# Patient Record
Sex: Female | Born: 1995 | Race: White | Hispanic: No | Marital: Single | Smoking: Never smoker
Health system: Southern US, Community
[De-identification: ages and names within clinical notes are randomized; demographics above are authoritative.]

## PROBLEM LIST (undated history)

## (undated) DIAGNOSIS — R51 Headache: Secondary | ICD-10-CM

## (undated) DIAGNOSIS — R519 Headache, unspecified: Secondary | ICD-10-CM

## (undated) DIAGNOSIS — L709 Acne, unspecified: Secondary | ICD-10-CM

## (undated) HISTORY — PX: TONSILLECTOMY: SUR1361

## (undated) HISTORY — DX: Acne, unspecified: L70.9

---
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
## 2015-03-28 ENCOUNTER — Encounter (HOSPITAL_COMMUNITY)
Admission: EM | Disposition: A | Discharge status: Home / Self Care | Payer: Self-pay | Source: Home / Self Care | Attending: Emergency Medicine

## 2015-03-28 ENCOUNTER — Emergency Department (HOSPITAL_COMMUNITY): Payer: BLUE CROSS/BLUE SHIELD | Admitting: Anesthesiology

## 2015-03-28 ENCOUNTER — Emergency Department (HOSPITAL_COMMUNITY): Payer: BLUE CROSS/BLUE SHIELD

## 2015-03-28 ENCOUNTER — Observation Stay (HOSPITAL_COMMUNITY)
Admission: EM | Admit: 2015-03-28 | Discharge: 2015-03-29 | Disposition: A | Discharge status: Home / Self Care | Payer: BLUE CROSS/BLUE SHIELD | Attending: General Surgery | Admitting: General Surgery

## 2015-03-28 ENCOUNTER — Encounter (HOSPITAL_COMMUNITY): Payer: Self-pay | Admitting: *Deleted

## 2015-03-28 DIAGNOSIS — Z7982 Long term (current) use of aspirin: Secondary | ICD-10-CM | POA: Diagnosis not present

## 2015-03-28 DIAGNOSIS — K353 Acute appendicitis with localized peritonitis, without perforation or gangrene: Secondary | ICD-10-CM

## 2015-03-28 DIAGNOSIS — Z793 Long term (current) use of hormonal contraceptives: Secondary | ICD-10-CM | POA: Diagnosis not present

## 2015-03-28 DIAGNOSIS — K37 Unspecified appendicitis: Secondary | ICD-10-CM | POA: Diagnosis present

## 2015-03-28 DIAGNOSIS — R1031 Right lower quadrant pain: Secondary | ICD-10-CM | POA: Diagnosis present

## 2015-03-28 HISTORY — PX: LAPAROSCOPIC APPENDECTOMY: SHX408

## 2015-03-28 HISTORY — PX: APPENDECTOMY: SHX54

## 2015-03-28 HISTORY — DX: Headache, unspecified: R51.9

## 2015-03-28 HISTORY — DX: Headache: R51

## 2015-03-28 LAB — CBC WITH DIFFERENTIAL/PLATELET
Basophils Absolute: 0.1 10*3/uL (ref 0.0–0.1)
Basophils Relative: 0 %
Eosinophils Absolute: 0.1 10*3/uL (ref 0.0–0.7)
Eosinophils Relative: 1 %
HEMATOCRIT: 39.1 % (ref 36.0–46.0)
Hemoglobin: 13.6 g/dL (ref 12.0–15.0)
LYMPHS PCT: 16 %
Lymphs Abs: 1.8 10*3/uL (ref 0.7–4.0)
MCH: 32.4 pg (ref 26.0–34.0)
MCHC: 34.8 g/dL (ref 30.0–36.0)
MCV: 93.1 fL (ref 78.0–100.0)
MONO ABS: 1.1 10*3/uL — AB (ref 0.1–1.0)
MONOS PCT: 10 %
NEUTROS ABS: 8.1 10*3/uL — AB (ref 1.7–7.7)
Neutrophils Relative %: 73 %
Platelets: 241 10*3/uL (ref 150–400)
RBC: 4.2 MIL/uL (ref 3.87–5.11)
RDW: 11.7 % (ref 11.5–15.5)
WBC: 11.2 10*3/uL — AB (ref 4.0–10.5)

## 2015-03-28 LAB — URINALYSIS, ROUTINE W REFLEX MICROSCOPIC
BILIRUBIN URINE: NEGATIVE
Glucose, UA: NEGATIVE mg/dL
HGB URINE DIPSTICK: NEGATIVE
KETONES UR: NEGATIVE mg/dL
Leukocytes, UA: NEGATIVE
NITRITE: NEGATIVE
PH: 6 (ref 5.0–8.0)
Protein, ur: NEGATIVE mg/dL
Specific Gravity, Urine: 1.021 (ref 1.005–1.030)

## 2015-03-28 LAB — COMPREHENSIVE METABOLIC PANEL
ALK PHOS: 48 U/L (ref 38–126)
ALT: 10 U/L — AB (ref 14–54)
AST: 16 U/L (ref 15–41)
Albumin: 3.6 g/dL (ref 3.5–5.0)
Anion gap: 8 (ref 5–15)
BUN: 10 mg/dL (ref 6–20)
CALCIUM: 9.3 mg/dL (ref 8.9–10.3)
CO2: 24 mmol/L (ref 22–32)
CREATININE: 0.74 mg/dL (ref 0.44–1.00)
Chloride: 109 mmol/L (ref 101–111)
Glucose, Bld: 102 mg/dL — ABNORMAL HIGH (ref 65–99)
Potassium: 4 mmol/L (ref 3.5–5.1)
Sodium: 141 mmol/L (ref 135–145)
Total Bilirubin: 0.8 mg/dL (ref 0.3–1.2)
Total Protein: 5.9 g/dL — ABNORMAL LOW (ref 6.5–8.1)

## 2015-03-28 LAB — I-STAT BETA HCG BLOOD, ED (MC, WL, AP ONLY): I-stat hCG, quantitative: <5 m[IU]/mL (ref <5)

## 2015-03-28 LAB — LIPASE, BLOOD: LIPASE: 27 U/L (ref 11–51)

## 2015-03-28 SURGERY — APPENDECTOMY, LAPAROSCOPIC
Anesthesia: General | Site: Abdomen

## 2015-03-28 MED ORDER — BUPIVACAINE-EPINEPHRINE (PF) 0.25% -1:200000 IJ SOLN
INTRAMUSCULAR | Status: AC
  Filled 2015-03-28: qty 30

## 2015-03-28 MED ORDER — HYDROMORPHONE HCL 1 MG/ML IJ SOLN
0.2500 mg | INTRAMUSCULAR | Status: DC | PRN
  Administered 2015-03-28 (×4): 0.5 mg via INTRAVENOUS

## 2015-03-28 MED ORDER — OXYCODONE HCL 5 MG PO TABS
ORAL_TABLET | ORAL | Status: AC
  Filled 2015-03-28: qty 1

## 2015-03-28 MED ORDER — HYDROMORPHONE HCL 1 MG/ML IJ SOLN
1.0000 mg | Freq: Once | INTRAMUSCULAR | Status: AC
  Administered 2015-03-28: 1 mg via INTRAVENOUS
  Filled 2015-03-28: qty 1

## 2015-03-28 MED ORDER — ACETAMINOPHEN 325 MG PO TABS: 650.0000 mg | ORAL_TABLET | Freq: Four times a day (QID) | ORAL | Status: DC | PRN

## 2015-03-28 MED ORDER — FENTANYL CITRATE (PF) 100 MCG/2ML IJ SOLN
INTRAMUSCULAR | Status: DC | PRN
  Administered 2015-03-28 (×4): 50 ug via INTRAVENOUS

## 2015-03-28 MED ORDER — IOHEXOL 300 MG/ML  SOLN: 25.0000 mL | Freq: Once | INTRAMUSCULAR | Status: DC | PRN

## 2015-03-28 MED ORDER — HYDROMORPHONE HCL 1 MG/ML IJ SOLN
INTRAMUSCULAR | Status: AC
  Administered 2015-03-28: 0.5 mg via INTRAVENOUS
  Filled 2015-03-28: qty 1

## 2015-03-28 MED ORDER — FENTANYL CITRATE (PF) 250 MCG/5ML IJ SOLN
INTRAMUSCULAR | Status: AC
  Filled 2015-03-28: qty 5

## 2015-03-28 MED ORDER — BUPIVACAINE-EPINEPHRINE 0.25% -1:200000 IJ SOLN
INTRAMUSCULAR | Status: DC | PRN
  Administered 2015-03-28: 11 mL

## 2015-03-28 MED ORDER — ONDANSETRON HCL 4 MG/2ML IJ SOLN
INTRAMUSCULAR | Status: DC | PRN
  Administered 2015-03-28: 4 mg via INTRAVENOUS

## 2015-03-28 MED ORDER — ONDANSETRON 4 MG PO TBDP: 4.0000 mg | ORAL_TABLET | Freq: Four times a day (QID) | ORAL | Status: DC | PRN

## 2015-03-28 MED ORDER — ONDANSETRON HCL 4 MG/2ML IJ SOLN
4.0000 mg | Freq: Once | INTRAMUSCULAR | Status: AC
  Administered 2015-03-28: 4 mg via INTRAVENOUS
  Filled 2015-03-28: qty 2

## 2015-03-28 MED ORDER — OXYCODONE HCL 5 MG PO TABS
5.0000 mg | ORAL_TABLET | ORAL | Status: DC | PRN
  Administered 2015-03-28: 5 mg via ORAL
  Filled 2015-03-28: qty 2

## 2015-03-28 MED ORDER — LIDOCAINE HCL (CARDIAC) 20 MG/ML IV SOLN
INTRAVENOUS | Status: DC | PRN
  Administered 2015-03-28: 100 mg via INTRAVENOUS

## 2015-03-28 MED ORDER — PROPOFOL 10 MG/ML IV BOLUS
INTRAVENOUS | Status: AC
  Filled 2015-03-28: qty 20

## 2015-03-28 MED ORDER — IOHEXOL 300 MG/ML  SOLN
100.0000 mL | Freq: Once | INTRAMUSCULAR | Status: AC | PRN
  Administered 2015-03-28: 100 mL via INTRAVENOUS

## 2015-03-28 MED ORDER — MIDAZOLAM HCL 5 MG/5ML IJ SOLN
INTRAMUSCULAR | Status: DC | PRN
  Administered 2015-03-28: 2 mg via INTRAVENOUS

## 2015-03-28 MED ORDER — 0.9 % SODIUM CHLORIDE (POUR BTL) OPTIME
TOPICAL | Status: DC | PRN
  Administered 2015-03-28: 1000 mL

## 2015-03-28 MED ORDER — SODIUM CHLORIDE 0.9 % IV BOLUS (SEPSIS)
1000.0000 mL | Freq: Once | INTRAVENOUS | Status: AC
  Administered 2015-03-28: 1000 mL via INTRAVENOUS

## 2015-03-28 MED ORDER — SUGAMMADEX SODIUM 200 MG/2ML IV SOLN
INTRAVENOUS | Status: DC | PRN
  Administered 2015-03-28: 200 mg via INTRAVENOUS

## 2015-03-28 MED ORDER — SIMETHICONE 80 MG PO CHEW: 40.0000 mg | CHEWABLE_TABLET | Freq: Four times a day (QID) | ORAL | Status: DC | PRN

## 2015-03-28 MED ORDER — KETOROLAC TROMETHAMINE 15 MG/ML IJ SOLN
INTRAMUSCULAR | Status: AC
  Filled 2015-03-28: qty 1

## 2015-03-28 MED ORDER — SUCCINYLCHOLINE CHLORIDE 20 MG/ML IJ SOLN
INTRAMUSCULAR | Status: DC | PRN
  Administered 2015-03-28: 80 mg via INTRAVENOUS

## 2015-03-28 MED ORDER — ONDANSETRON HCL 4 MG/2ML IJ SOLN
4.0000 mg | Freq: Four times a day (QID) | INTRAMUSCULAR | Status: DC | PRN
Start: 2015-03-28 — End: 2015-03-29

## 2015-03-28 MED ORDER — MORPHINE SULFATE (PF) 2 MG/ML IV SOLN
2.0000 mg | INTRAVENOUS | Status: DC | PRN
Start: 2015-03-28 — End: 2015-03-29
  Administered 2015-03-28 – 2015-03-29 (×3): 2 mg via INTRAVENOUS
  Filled 2015-03-28 (×3): qty 1

## 2015-03-28 MED ORDER — ROCURONIUM BROMIDE 100 MG/10ML IV SOLN
INTRAVENOUS | Status: DC | PRN
  Administered 2015-03-28: 20 mg via INTRAVENOUS
  Administered 2015-03-28: 10 mg via INTRAVENOUS

## 2015-03-28 MED ORDER — SODIUM CHLORIDE 0.9 % IR SOLN
Status: DC | PRN
Start: 2015-03-28 — End: 2015-03-28
  Administered 2015-03-28: 1000 mL

## 2015-03-28 MED ORDER — MORPHINE SULFATE (PF) 4 MG/ML IV SOLN
4.0000 mg | Freq: Once | INTRAVENOUS | Status: AC
  Administered 2015-03-28: 4 mg via INTRAVENOUS
  Filled 2015-03-28: qty 1

## 2015-03-28 MED ORDER — PROMETHAZINE HCL 25 MG/ML IJ SOLN: 6.2500 mg | INTRAMUSCULAR | Status: DC | PRN

## 2015-03-28 MED ORDER — LACTATED RINGERS IV SOLN
INTRAVENOUS | Status: DC | PRN
  Administered 2015-03-28: 17:00:00 via INTRAVENOUS

## 2015-03-28 MED ORDER — MIDAZOLAM HCL 2 MG/2ML IJ SOLN
INTRAMUSCULAR | Status: AC
  Filled 2015-03-28: qty 2

## 2015-03-28 MED ORDER — SODIUM CHLORIDE 0.9 % IV SOLN: INTRAVENOUS | Status: DC

## 2015-03-28 MED ORDER — DEXTROSE 5 % IV SOLN
2.0000 g | INTRAVENOUS | Status: DC
  Administered 2015-03-28: 2 g via INTRAVENOUS
  Filled 2015-03-28: qty 2

## 2015-03-28 MED ORDER — ACETAMINOPHEN 650 MG RE SUPP: 650.0000 mg | Freq: Four times a day (QID) | RECTAL | Status: DC | PRN

## 2015-03-28 MED ORDER — KETOROLAC TROMETHAMINE 15 MG/ML IJ SOLN
15.0000 mg | Freq: Three times a day (TID) | INTRAMUSCULAR | Status: DC | PRN
  Administered 2015-03-28: 15 mg via INTRAVENOUS
  Filled 2015-03-28: qty 1

## 2015-03-28 MED ORDER — SUGAMMADEX SODIUM 200 MG/2ML IV SOLN
INTRAVENOUS | Status: AC
  Filled 2015-03-28: qty 2

## 2015-03-28 MED ORDER — METHOCARBAMOL 500 MG PO TABS
500.0000 mg | ORAL_TABLET | Freq: Four times a day (QID) | ORAL | Status: DC | PRN
  Administered 2015-03-28 – 2015-03-29 (×2): 500 mg via ORAL
  Filled 2015-03-28 (×2): qty 1

## 2015-03-28 MED ORDER — PROPOFOL 10 MG/ML IV BOLUS
INTRAVENOUS | Status: DC | PRN
  Administered 2015-03-28: 200 mg via INTRAVENOUS

## 2015-03-28 SURGICAL SUPPLY — 49 items
APPLIER CLIP ROT 10 11.4 M/L (STAPLE)
APR CLP MED LRG 11.4X10 (STAPLE)
BAG SPEC RTRVL 10 TROC 200 (ENDOMECHANICALS) ×1
CANISTER SUCTION 2500CC (MISCELLANEOUS) ×3 IMPLANT
CHLORAPREP W/TINT 26ML (MISCELLANEOUS) ×3 IMPLANT
CLIP APPLIE ROT 10 11.4 M/L (STAPLE) IMPLANT
CLOSURE STERI-STRIP 1/4X4 (GAUZE/BANDAGES/DRESSINGS) ×2 IMPLANT
CLOSURE WOUND 1/2 X4 (GAUZE/BANDAGES/DRESSINGS) ×1
COVER SURGICAL LIGHT HANDLE (MISCELLANEOUS) ×3 IMPLANT
CUTTER FLEX LINEAR 45M (STAPLE) ×3 IMPLANT
DEVICE TROCAR PUNCTURE CLOSURE (ENDOMECHANICALS) ×3 IMPLANT
ELECT REM PT RETURN 9FT ADLT (ELECTROSURGICAL) ×3
ELECTRODE REM PT RTRN 9FT ADLT (ELECTROSURGICAL) ×1 IMPLANT
GLOVE BIO SURGEON STRL SZ7 (GLOVE) ×5 IMPLANT
GLOVE BIOGEL PI IND STRL 7.0 (GLOVE) IMPLANT
GLOVE BIOGEL PI IND STRL 7.5 (GLOVE) ×1 IMPLANT
GLOVE BIOGEL PI INDICATOR 7.0 (GLOVE) ×2
GLOVE BIOGEL PI INDICATOR 7.5 (GLOVE) ×2
GLOVE SURG SS PI 6.5 STRL IVOR (GLOVE) ×2 IMPLANT
GLOVE SURG SS PI 7.0 STRL IVOR (GLOVE) ×2 IMPLANT
GLOVE SURG SS PI 7.5 STRL IVOR (GLOVE) ×2 IMPLANT
GOWN STRL REUS W/ TWL LRG LVL3 (GOWN DISPOSABLE) ×3 IMPLANT
GOWN STRL REUS W/TWL LRG LVL3 (GOWN DISPOSABLE) ×9
KIT BASIN OR (CUSTOM PROCEDURE TRAY) ×3 IMPLANT
KIT ROOM TURNOVER OR (KITS) ×3 IMPLANT
LIQUID BAND (GAUZE/BANDAGES/DRESSINGS) ×3 IMPLANT
NS IRRIG 1000ML POUR BTL (IV SOLUTION) ×3 IMPLANT
PAD ARMBOARD 7.5X6 YLW CONV (MISCELLANEOUS) ×6 IMPLANT
POUCH RETRIEVAL ECOSAC 10 (ENDOMECHANICALS) ×1 IMPLANT
POUCH RETRIEVAL ECOSAC 10MM (ENDOMECHANICALS) ×2
RELOAD 45 VASCULAR/THIN (ENDOMECHANICALS) ×3 IMPLANT
RELOAD STAPLE 45 2.5 WHT GRN (ENDOMECHANICALS) ×1 IMPLANT
RELOAD STAPLE 45 3.5 BLU ETS (ENDOMECHANICALS) IMPLANT
RELOAD STAPLE TA45 3.5 REG BLU (ENDOMECHANICALS) ×3 IMPLANT
SCALPEL HARMONIC ACE (MISCELLANEOUS) ×3 IMPLANT
SCISSORS LAP 5X35 DISP (ENDOMECHANICALS) ×2 IMPLANT
SET IRRIG TUBING LAPAROSCOPIC (IRRIGATION / IRRIGATOR) ×3 IMPLANT
SLEEVE ENDOPATH XCEL 5M (ENDOMECHANICALS) ×3 IMPLANT
SPECIMEN JAR SMALL (MISCELLANEOUS) ×3 IMPLANT
STRIP CLOSURE SKIN 1/2X4 (GAUZE/BANDAGES/DRESSINGS) ×2 IMPLANT
SUT MNCRL AB 4-0 PS2 18 (SUTURE) ×3 IMPLANT
SUT VICRYL 0 UR6 27IN ABS (SUTURE) ×3 IMPLANT
TOWEL OR 17X24 6PK STRL BLUE (TOWEL DISPOSABLE) ×3 IMPLANT
TOWEL OR 17X26 10 PK STRL BLUE (TOWEL DISPOSABLE) ×3 IMPLANT
TRAY FOLEY CATH 16FR SILVER (SET/KITS/TRAYS/PACK) ×3 IMPLANT
TRAY LAPAROSCOPIC MC (CUSTOM PROCEDURE TRAY) ×3 IMPLANT
TROCAR XCEL BLUNT TIP 100MML (ENDOMECHANICALS) ×3 IMPLANT
TROCAR XCEL NON-BLD 5MMX100MML (ENDOMECHANICALS) ×3 IMPLANT
TUBING INSUFFLATION (TUBING) ×3 IMPLANT

## 2015-03-28 NOTE — H&P (Signed)
Ashlee Acosta is an 20 y.o. female.   Chief Complaint: abd pain HPI: Ashlee Acosta who began to have abd pain last night now primarily in rlq.  Nausea and emesis. No fever. Presented to er and underwent ct that shows appendicitis   History reviewed. No pertinent past medical history.  Past Surgical History  Procedure Laterality Date  . Tonsillectomy      History reviewed. No pertinent family history. Social History:  reports that she has never smoked. She does not have any smokeless tobacco history on file. She reports that she does not drink alcohol or use illicit drugs.  Allergies: No Known Allergies  meds none  Results for orders placed or performed during the hospital encounter of 03/28/15 (from the past 48 hour(s))  CBC with Differential     Status: Abnormal   Collection Time: 03/28/15 10:15 AM  Result Value Ref Range   WBC 11.2 (H) 4.0 - 10.5 K/uL   RBC 4.20 3.87 - 5.11 MIL/uL   Hemoglobin 13.6 12.0 - 15.0 g/dL   HCT 39.1 36.0 - 46.0 %   MCV 93.1 78.0 - 100.0 fL   MCH 32.4 26.0 - 34.0 pg   MCHC 34.8 30.0 - 36.0 g/dL   RDW 11.7 11.5 - 15.5 %   Platelets 241 150 - 400 K/uL   Neutrophils Relative % Ashlee %   Neutro Abs 8.1 (H) 1.7 - 7.7 K/uL   Lymphocytes Relative 16 %   Lymphs Abs 1.8 0.7 - 4.0 K/uL   Monocytes Relative 10 %   Monocytes Absolute 1.1 (H) 0.1 - 1.0 K/uL   Eosinophils Relative 1 %   Eosinophils Absolute 0.1 0.0 - 0.7 K/uL   Basophils Relative 0 %   Basophils Absolute 0.1 0.0 - 0.1 K/uL  Comprehensive metabolic panel     Status: Abnormal   Collection Time: 03/28/15 10:15 AM  Result Value Ref Range   Sodium 141 135 - 145 mmol/L   Potassium 4.0 3.5 - 5.1 mmol/L   Chloride 109 101 - 111 mmol/L   CO2 24 22 - 32 mmol/L   Glucose, Bld 102 (H) 65 - 99 mg/dL   BUN 10 6 - 20 mg/dL   Creatinine, Ser 0.74 0.44 - 1.00 mg/dL   Calcium 9.3 8.9 - 10.3 mg/dL   Total Protein 5.9 (L) 6.5 - 8.1 g/dL   Albumin 3.6 3.5 - 5.0 g/dL   AST 16 15 - 41 U/L   ALT 10 (L) 14 - 54 U/L    Alkaline Phosphatase 48 38 - 126 U/L   Total Bilirubin 0.8 0.3 - 1.2 mg/dL   GFR calc non Af Amer >60 >60 mL/min   GFR calc Af Amer >60 >60 mL/min    Comment: (NOTE) The eGFR has been calculated using the CKD EPI equation. This calculation has not been validated in all clinical situations. eGFR's persistently <60 mL/min signify possible Chronic Kidney Disease.    Anion gap 8 5 - 15  Urinalysis, Routine w reflex microscopic (not at [facility])     Status: None   Collection Time: 03/28/15 10:15 AM  Result Value Ref Range   Color, Urine YELLOW YELLOW   APPearance CLEAR CLEAR   Specific Gravity, Urine 1.021 1.005 - 1.030   pH 6.0 5.0 - 8.0   Glucose, UA NEGATIVE NEGATIVE mg/dL   Hgb urine dipstick NEGATIVE NEGATIVE   Bilirubin Urine NEGATIVE NEGATIVE   Ketones, ur NEGATIVE NEGATIVE mg/dL   Protein, ur NEGATIVE NEGATIVE mg/dL  Nitrite NEGATIVE NEGATIVE   Leukocytes, UA NEGATIVE NEGATIVE    Comment: MICROSCOPIC NOT DONE ON URINES WITH NEGATIVE PROTEIN, BLOOD, LEUKOCYTES, NITRITE, OR GLUCOSE <1000 mg/dL.  Lipase, blood     Status: None   Collection Time: 03/28/15 10:15 AM  Result Value Ref Range   Lipase 27 11 - 51 U/L  I-Stat Beta hCG blood, ED ([facility], [facility], [facility] only)     Status: None   Collection Time: 03/28/15 10:20 AM  Result Value Ref Range   I-stat hCG, quantitative <5.0 <5 mIU/mL   Comment 3            Comment:   GEST. AGE      CONC.  (mIU/mL)   <=1 WEEK        5 - 50     2 WEEKS       50 - 500     3 WEEKS       100 - 10,000     4 WEEKS     1,000 - 30,000        FEMALE AND NON-PREGNANT FEMALE:     LESS THAN 5 mIU/mL    Ct Abdomen Pelvis W Contrast  03/28/2015  CLINICAL DATA:  Right lower quadrant pain radiating to the right flank today. EXAM: CT ABDOMEN AND PELVIS WITH CONTRAST TECHNIQUE: Multidetector CT imaging of the abdomen and pelvis was performed using the standard protocol following bolus administration of intravenous contrast. CONTRAST:  180m OMNIPAQUE IOHEXOL 300 MG/ML   SOLN COMPARISON:  None. FINDINGS: Lower chest:  Unremarkable. Hepatobiliary: 6 mm hypodensity right liver is too small to characterize. Liver otherwise normal in appearance. There is no evidence for gallstones, gallbladder wall thickening, or pericholecystic fluid. No intrahepatic or extrahepatic biliary dilation. Pancreas: No focal mass lesion. No dilatation of the main duct. No intraparenchymal cyst. No peripancreatic edema. Spleen: No splenomegaly. No focal mass lesion. Adrenals/Urinary Tract: No adrenal nodule or mass. Kidneys are normal in appearance bilaterally. No evidence for hydroureter. The urinary bladder appears normal for the degree of distention. Stomach/Bowel: Stomach is nondistended. No gastric wall thickening. No evidence of outlet obstruction. Duodenum is normally positioned as is the ligament of Treitz. No small bowel wall thickening. No small bowel dilatation. The terminal ileum is normal. Tip of appendix dilated up to 11-12 mm diameter with wall thickening and periappendiceal edema/inflammation. No evidence for extraluminal gas or abscess. No gross colonic mass. No colonic wall thickening. No substantial diverticular change. Vascular/Lymphatic: No abdominal aortic aneurysm. No abdominal atherosclerotic calcification. There is no gastrohepatic or hepatoduodenal ligament lymphadenopathy. No intraperitoneal or retroperitoneal lymphadenopy. Small lymph nodes identified in the ileocolic ligament. No pelvic sidewall lymphadenopathy. Reproductive: Uterus is unremarkable.  There is no adnexal mass. Other: No substantial intraperitoneal free fluid. Musculoskeletal: Bone windows reveal no worrisome lytic or sclerotic osseous lesions. IMPRESSION: Dilated, inflamed appendix with periappendiceal edema/ inflammation. CT imaging features consistent with acute appendicitis. I personally discussed these findings by telephone with Dr. PJohnney Killianat 1230 hours on 03/27/2014. Electronically Signed   By: EMisty Stanley M.D.   On: 03/28/2015 12:29    Review of Systems  Constitutional: Negative for fever and chills.  Respiratory: Negative for shortness of breath.   Cardiovascular: Negative for chest pain.  Gastrointestinal: Positive for nausea, vomiting and abdominal pain.    Blood pressure 108/Ashlee, pulse 84, temperature 98.6 F (37 C), temperature source Oral, resp. rate 16, height 5' 5"  (1.651 m), weight 63.504 kg (140 lb), SpO2 100 %. Physical Exam  Vitals reviewed. Constitutional: She appears well-developed and well-nourished.  Eyes: No scleral icterus.  Cardiovascular: Normal rate and regular rhythm.   Respiratory: Effort normal and breath sounds normal.  GI: Soft. There is tenderness in the right lower quadrant.     Assessment/Plan Appendicitis  Appears to have simple appendicitis. Recommended lap appy. Risks discussed. Will need to remove as many piercings as possible and I will need to remove the dermal piercings on her abdominal wall.    Berenice Oehlert 03/28/2015, 3:36 PM

## 2015-03-28 NOTE — Anesthesia Preprocedure Evaluation (Addendum)
Anesthesia Evaluation  Patient identified by MRN, date of birth, ID band Patient awake    Reviewed: Allergy & Precautions, H&P , Patient's Chart, lab work & pertinent test results  History of Anesthesia Complications Negative for: history of anesthetic complications  Airway Mallampati: II  TM Distance: >3 FB Neck ROM: full    Dental no notable dental hx.    Pulmonary neg pulmonary ROS,    Pulmonary exam normal breath sounds clear to auscultation       Cardiovascular negative cardio ROS Normal cardiovascular exam Rhythm:regular Rate:Normal     Neuro/Psych negative neurological ROS     GI/Hepatic negative GI ROS, Neg liver ROS,   Endo/Other  negative endocrine ROS  Renal/GU negative Renal ROS     Musculoskeletal   Abdominal   Peds  Hematology negative hematology ROS (+)   Anesthesia Other Findings Acute appendicitis  HCG negative  Reproductive/Obstetrics                            Anesthesia Physical Anesthesia Plan  ASA: II  Anesthesia Plan: General   Post-op Pain Management:    Induction: Intravenous and Rapid sequence  Airway Management Planned: Oral ETT  Additional Equipment:   Intra-op Plan:   Post-operative Plan: Extubation in OR  Informed Consent: I have reviewed the patients History and Physical, chart, labs and discussed the procedure including the risks, benefits and alternatives for the proposed anesthesia with the patient or authorized representative who has indicated his/her understanding and acceptance.   Dental Advisory Given  Plan Discussed with: Anesthesiologist and CRNA  Anesthesia Plan Comments:         Anesthesia Quick Evaluation

## 2015-03-28 NOTE — ED Notes (Signed)
Dr. Dwain SarnaWakefield (general surgeon) at bedside.

## 2015-03-28 NOTE — Transfer of Care (Signed)
Immediate Anesthesia Transfer of Care Note  Patient: Ashlee Acosta  Procedure(s) Performed: Procedure(s): APPENDECTOMY LAPAROSCOPIC, Removal of Dermal Piercings (x2) (N/A)  Patient Location: PACU  Anesthesia Type:General  Level of Consciousness: awake, alert , patient cooperative and responds to stimulation  Airway & Oxygen Therapy: Patient Spontanous Breathing and Patient connected to nasal cannula oxygen  Post-op Assessment: Report given to RN, Post -op Vital signs reviewed and stable and Patient moving all extremities X 4  Post vital signs: Reviewed and stable  Last Vitals:  Filed Vitals:   03/28/15 1545 03/28/15 1733  BP: 109/80 113/73  Pulse: 80 98  Temp:  36.7 C  Resp:  19    Complications: No apparent anesthesia complications

## 2015-03-28 NOTE — ED Notes (Signed)
Called CT to inquire timeframe for scan.

## 2015-03-28 NOTE — ED Notes (Signed)
Consent for surgery signed and completed.

## 2015-03-28 NOTE — Anesthesia Procedure Notes (Signed)
Procedure Name: Intubation Date/Time: 03/28/2015 4:36 PM Performed by: Virgel GessHOLTZMAN, [place]er [place] LEFFEW Pre-anesthesia Checklist: Patient identified, Patient being monitored, Timeout performed, Emergency Drugs available and Suction available Patient Re-evaluated:Patient Re-evaluated prior to inductionOxygen Delivery Method: Circle System Utilized Preoxygenation: Pre-oxygenation with 100% oxygen Intubation Type: IV induction Ventilation: Mask ventilation without difficulty Laryngoscope Size: Mac and 3 Grade View: Grade I Tube type: Oral Tube size: 7.0 mm Number of attempts: 1 Airway Equipment and Method: Stylet Placement Confirmation: ETT inserted through vocal cords under direct vision,  positive ETCO2 and breath sounds checked- equal and bilateral Secured at: 22 cm Tube secured with: Tape Dental Injury: Teeth and Oropharynx as per pre-operative assessment

## 2015-03-28 NOTE — ED Provider Notes (Signed)
CSN: [id number]     Arrival date & time 03/28/15  0912 History   First MD Initiated Contact with Patient 03/28/15 1013     Chief Complaint  Patient presents with  . Abdominal Pain  . Emesis    HPI   Ms. Jezek is an 20 y.o. female with no significant PMH who presents to the ED for evaluation of abdominal pain. She states she was in her usual state of health until last night when she had sudden onset RLQ pain with associated nausea that woke her from sleep. She states the pain is constant and radiates around her side to her back. She endorses one episode of emesis. Denies diarrhea. States the pain is even worse today. She has not tried anything for the pain. She endorses chills, is unsure if she had fever at home. Denies dysuria, urinary frequency/urgency. Denies vaginal discharge or bleeding. States she still has her appendix.     History reviewed. No pertinent past medical history. Past Surgical History  Procedure Laterality Date  . Tonsillectomy     History reviewed. No pertinent family history. Social History  Substance Use Topics  . Smoking status: Never Smoker   . Smokeless tobacco: None  . Alcohol Use: No   OB History    No data available     Review of Systems  All other systems reviewed and are negative.     Allergies  Review of patient's allergies indicates no known allergies.  Home Medications   Prior to Admission medications   Medication Sig Start Date End Date Taking? Authorizing Provider  aspirin 325 MG tablet Take 325 mg by mouth every 6 (six) hours as needed.   Yes Historical Provider, MD  Aspirin-Acetaminophen-Caffeine (GOODY HEADACHE PO) Take 1 packet by mouth every 8 (eight) hours as needed (headache).   Yes Historical Provider, MD  etonogestrel (NEXPLANON) 68 MG IMPL implant 1 each by Subdermal route once.   Yes Historical Provider, MD  ibuprofen (ADVIL,MOTRIN) 200 MG tablet Take 200 mg by mouth every 6 (six) hours as needed for moderate pain.   Yes  Historical Provider, MD   BP 106/68 mmHg  Pulse 106  Temp(Src) 98.6 F (37 C) (Oral)  Resp 16  Ht 5\' 5"  (1.651 m)  Wt 63.504 kg  BMI 23.30 kg/m2  SpO2 100% Physical Exam  Constitutional: She is oriented to person, place, and time.  Pt appears very uncomfortable, holding right side.   HENT:  Right Ear: External ear normal.  Left Ear: External ear normal.  Nose: Nose normal.  Mouth/Throat: Oropharynx is clear and moist.  Eyes: Conjunctivae and EOM are normal. Pupils are equal, round, and reactive to light.  Neck: Normal range of motion. Neck supple.  Cardiovascular: Normal rate, regular rhythm, normal heart sounds and intact distal pulses.   Pulmonary/Chest: Effort normal and breath sounds normal. No respiratory distress. She exhibits no tenderness.  Abdominal: Soft. Bowel sounds are normal. She exhibits no distension. There is tenderness. There is tenderness at McBurney's point. There is no rebound and no CVA tenderness.  Marked RLQ tenderness with guarding. +Rovsings.   Musculoskeletal: She exhibits no edema.  Neurological: She is alert and oriented to person, place, and time. No cranial nerve deficit.  Skin: Skin is warm and dry.  Psychiatric: She has a normal mood and affect.  Nursing note and vitals reviewed.  Filed Vitals:   03/28/15 1345 03/28/15 1400 03/28/15 1415 03/28/15 1500  BP: 108/69 107/69 95/61 110/87  Pulse: 75 81 71  83  Temp:      TempSrc:      Resp:      Height:      Weight:      SpO2: 98% 98% 99% 100%     ED Course  Procedures (including critical care time) Labs Review Labs Reviewed  CBC WITH DIFFERENTIAL/PLATELET - Abnormal; Notable for the following:    WBC 11.2 (*)    Neutro Abs 8.1 (*)    Monocytes Absolute 1.1 (*)    All other components within normal limits  COMPREHENSIVE METABOLIC PANEL - Abnormal; Notable for the following:    Glucose, Bld 102 (*)    Total Protein 5.9 (*)    ALT 10 (*)    All other components within normal limits   URINALYSIS, ROUTINE W REFLEX MICROSCOPIC (NOT AT [facility])  LIPASE, BLOOD  I-STAT BETA HCG BLOOD, ED ([facility], [facility], [facility] ONLY)    Imaging Review Ct Abdomen Pelvis W Contrast  03/28/2015  CLINICAL DATA:  Right lower quadrant pain radiating to the right flank today. EXAM: CT ABDOMEN AND PELVIS WITH CONTRAST TECHNIQUE: Multidetector CT imaging of the abdomen and pelvis was performed using the standard protocol following bolus administration of intravenous contrast. CONTRAST:  OMNIPAQUE IOHEXOL 300 MG/ML  SOLN COMPARISON:  None. FINDINGS: Lower chest:  Unremarkable. Hepatobiliary: 6 mm hypodensity right liver is too small to characterize. Liver otherwise normal in appearance. There is no evidence for gallstones, gallbladder wall thickening, or pericholecystic fluid. No intrahepatic or extrahepatic biliary dilation. Pancreas: No focal mass lesion. No dilatation of the main duct. No intraparenchymal cyst. No peripancreatic edema. Spleen: No splenomegaly. No focal mass lesion. Adrenals/Urinary Tract: No adrenal nodule or mass. Kidneys are normal in appearance bilaterally. No evidence for hydroureter. The urinary bladder appears normal for the degree of distention. Stomach/Bowel: Stomach is nondistended. No gastric wall thickening. No evidence of outlet obstruction. Duodenum is normally positioned as is the ligament of Treitz. No small bowel wall thickening. No small bowel dilatation. The terminal ileum is normal. Tip of appendix dilated up to 11-12 mm diameter with wall thickening and periappendiceal edema/inflammation. No evidence for extraluminal gas or abscess. No gross colonic mass. No colonic wall thickening. No substantial diverticular change. Vascular/Lymphatic: No abdominal aortic aneurysm. No abdominal atherosclerotic calcification. There is no gastrohepatic or hepatoduodenal ligament lymphadenopathy. No intraperitoneal or retroperitoneal lymphadenopy. Small lymph nodes identified in the ileocolic ligament. No  pelvic sidewall lymphadenopathy. Reproductive: Uterus is unremarkable.  There is no adnexal mass. Other: No substantial intraperitoneal free fluid. Musculoskeletal: Bone windows reveal no worrisome lytic or sclerotic osseous lesions. IMPRESSION: Dilated, inflamed appendix with periappendiceal edema/ inflammation. CT imaging features consistent with acute appendicitis. I personally discussed these findings by telephone with Dr. Donnald Garre at 1230 hours on 03/27/2014. Electronically Signed   By: Kennith Center M.D.   On: 03/28/2015 12:29   I have personally reviewed and evaluated these images and lab results as part of my medical decision-making.   EKG Interpretation None      MDM   Final diagnoses:  None    Given acute onset abd pain in RLQ, +rovsing's, pt's clinical appearance, I suspect possible appendicitis. I have lower suspicion for cholecystitis, pancreatitis, TOA, ovarian torsion, etc. Will order labs and CT abd/pelvis. Will give 1L NS bolus, zofran, and pain meds. Pt is afebrile in the ED. Slightly tachycardic which I suspect is 2/2 pain.  CT shows e/o appendicitis without rupture. Surgery consulted.  Carlene Coria, PA-C 03/28/15 1626  Arby Barrette,  MD 03/30/15 [id number]

## 2015-03-28 NOTE — ED Notes (Signed)
Pt states she woke up last night with severe pain to the right side with burning around to the back. Pt states she did have an episode of vomiting and has had nausea.

## 2015-03-28 NOTE — Anesthesia Postprocedure Evaluation (Signed)
Anesthesia Post Note  Patient: Ashlee Acosta  Procedure(s) Performed: Procedure(s) (LRB): APPENDECTOMY LAPAROSCOPIC, Removal of Dermal Piercings (x2) (N/A)  Patient location during evaluation: PACU Anesthesia Type: General Level of consciousness: awake and alert Pain management: pain level controlled Vital Signs Assessment: post-procedure vital signs reviewed and stable Respiratory status: spontaneous breathing, nonlabored ventilation and respiratory function stable Cardiovascular status: blood pressure returned to baseline and stable Postop Assessment: no signs of nausea or vomiting Anesthetic complications: no    Last Vitals:  Filed Vitals:   03/28/15 1745 03/28/15 1750  BP:  102/72  Pulse: 97 108  Temp:    Resp: 16 15    Last Pain:  Filed Vitals:   03/28/15 1750  PainSc: 8                  Reino KentJudd, Dymin Dingledine J

## 2015-03-28 NOTE — ED Notes (Signed)
Rocephin and  Consent sent with patient to OR. Belongings with mom.

## 2015-03-28 NOTE — Op Note (Signed)
Preoperative diagnosis: acute appendicitis Postoperative diagnosis: same as above Procedure: laparoscopic appendectomy Surgeon: Dr Harden MoMatt Dayden Viverette Anesthesia: general EBL: minimal Drains none Specimen appendix to pathology Complications: none Sponge count correct at completion Disposition to recovery stable  Indications: This is a [age] yof with rlq pain and ct with appendicitis. We discussed laparoscopic appendectomy.   Procedure: After informed consent was obtained the patient was taken to the operating room. She was already given antibiotics. Sequential compression devices were on her legs.She was placed under general anesthesia without complication. Her abdomen was prepped and draped in the standard sterile surgical fashion. A surgical timeout was then performed. A foley catheter was placed.   I removed two abdominal wall piercings with a clamp. I infiltrated marcaine below the umbilicus. I made an incision and then entered the fascia sharply. I then entered the peritoneum bluntly. I placed a 0 vicryl pursestring suture and inserted a hasson trocar.I then inserted 2 further 5 mm trocars in the suprapubic region and the left mid abdomen. She did appear to have acute appendicitis. I saw the TI into the right colon. I then dissected it free from the cecum. I divided the mesoappendix with the harmonic scalpel. I then divided the appendix with the gia stapler. I then placed this in a bag and removed it from the abdomen.  I then obtained hemostasis and irrigated. I then removed the umbilical trocar and closed with 0 vicryl and the endoclose device after tying down the pursestring.  I then desufflated the abdomen and removed all my remaining trocars. I then closed these with 4-0 Monocryl and Dermabond. She tolerated this well was extubated and transferred to the recovery room in stable condition

## 2015-03-29 ENCOUNTER — Encounter (HOSPITAL_COMMUNITY): Payer: Self-pay | Admitting: General Practice

## 2015-03-29 MED ORDER — ACETAMINOPHEN 325 MG PO TABS: 1000.0000 mg | ORAL_TABLET | Freq: Four times a day (QID) | ORAL | Status: AC | PRN

## 2015-03-29 MED ORDER — ENOXAPARIN SODIUM 40 MG/0.4ML ~~LOC~~ SOLN: 40.0000 mg | SUBCUTANEOUS | Status: DC

## 2015-03-29 MED ORDER — OXYCODONE HCL 5 MG PO TABS: 5.0000 mg | ORAL_TABLET | ORAL | Status: AC | PRN

## 2015-03-29 MED ORDER — KETOROLAC TROMETHAMINE 15 MG/ML IJ SOLN
30.0000 mg | Freq: Three times a day (TID) | INTRAMUSCULAR | Status: DC
  Administered 2015-03-29 (×2): 30 mg via INTRAVENOUS
  Filled 2015-03-29 (×2): qty 2

## 2015-03-29 MED ORDER — IBUPROFEN 200 MG PO TABS: 600.0000 mg | ORAL_TABLET | Freq: Three times a day (TID) | ORAL | Status: AC | PRN

## 2015-03-29 NOTE — Progress Notes (Signed)
DC home with mom. Verbally understood DC instructions, no questions asked

## 2015-03-29 NOTE — Discharge Summary (Signed)
Patient ID: Ashlee Acosta MRN: [id number] DOB/AGE: [birth year] 20 y.o.  Admit date: 03/28/2015 Discharge date: 03/29/2015  Procedures: lap appy  Consults: None  Reason for Admission:  20 yof who began to have abd pain last night now primarily in rlq. Nausea and emesis. No fever. Presented to er and underwent ct that shows appendicitis  Admission Diagnoses:  1. Acute appendicitis  Hospital Course: The patient was admitted and taken to the OR where she underwent a lap appy.  She tolerated this procedure well.  She was having some pain control issues initially, but after she was given toradol, this significantly helped her pain.  By later on POD 1, she was tolerating a solid diet and her pain was well controlled. She was stable for dc home.  Discharge Diagnoses:  Active Problems:   Appendicitis s/p lap appy  Discharge Medications:   Medication List    STOP taking these medications        aspirin 325 MG tablet     GOODY HEADACHE PO      TAKE these medications        acetaminophen 325 MG tablet  Commonly known as:  TYLENOL  Take 3 tablets (975 mg total) by mouth every 6 (six) hours as needed for mild pain or moderate pain (or temp > 100).     ibuprofen 200 MG tablet  Commonly known as:  ADVIL,MOTRIN  Take 3-4 tablets (600-800 mg total) by mouth every 8 (eight) hours as needed for moderate pain.     NEXPLANON 68 MG Impl implant  Generic drug:  etonogestrel  1 each by Subdermal route once.     oxyCODONE 5 MG immediate release tablet  Commonly known as:  Oxy IR/ROXICODONE  Take 1-2 tablets (5-10 mg total) by mouth every 4 (four) hours as needed for moderate pain.        Discharge Instructions: Follow-up Information    Follow up with LIEPINS, ANDY, PA-C On 04/20/2015.   Specialty:  Surgery   [facility]:  [facility], 8:45[facility], arrive no later than 8:15am for paperwork and check in   Contact information:   [address] [place]  [id number] [phone]       Signed: Letha CapeOSBORNE,Vale Peraza E 03/29/2015, 4:15 PM

## 2015-03-29 NOTE — Discharge Instructions (Signed)
[facility] ______[facility] °LAPAROSCOPIC SURGERY: POST OP INSTRUCTIONS °Always review your discharge instruction sheet given to you by the facility where your surgery was performed. °IF YOU HAVE DISABILITY OR FAMILY LEAVE FORMS, YOU MUST BRING THEM TO THE OFFICE FOR PROCESSING.   °DO NOT GIVE THEM TO YOUR DOCTOR. ° °1. A prescription for pain medication may be given to you upon discharge.  Take your pain medication as prescribed, if needed.  If narcotic pain medicine is not needed, then you may take acetaminophen (Tylenol) or ibuprofen (Advil) as needed. °2. Take your usually prescribed medications unless otherwise directed. °3. If you need a refill on your pain medication, please contact your pharmacy.  They will contact our office to request authorization. Prescriptions will not be filled after 5pm or on week-ends. °4. You should follow a light diet the first few days after arrival home, such as soup and crackers, etc.  Be sure to include lots of fluids daily. °5. Most patients will experience some swelling and bruising in the area of the incisions.  Ice packs will help.  Swelling and bruising can take several days to resolve.  °6. It is common to experience some constipation if taking pain medication after surgery.  Increasing fluid intake and taking a stool softener (such as Colace) will usually help or prevent this problem from occurring.  A mild laxative (Milk of Magnesia or Miralax) should be taken according to package instructions if there are no bowel movements after 48 hours. °7. Unless discharge instructions indicate otherwise, you may remove your bandages 24-48 hours after surgery, and you may shower at that time.  You may have steri-strips (small skin tapes) in place directly over the incision.  These strips should be left on the skin for 7-10 days.  If your surgeon used skin glue on the incision, you may shower in 24 hours.  The glue will flake off over the next 2-3 weeks.  Any sutures or  staples will be removed at the office during your follow-up visit. °8. ACTIVITIES:  You may resume regular (light) daily activities beginning the next day--such as daily self-care, walking, climbing stairs--gradually increasing activities as tolerated.  You may have sexual intercourse when it is comfortable.  Refrain from any heavy lifting or straining until approved by your doctor. °a. You may drive when you are no longer taking prescription pain medication, you can comfortably wear a seatbelt, and you can safely maneuver your car and apply brakes. °b. RETURN TO WORK:  __________________________________________________________ °9. You should see your doctor in the office for a follow-up appointment approximately 2-3 weeks after your surgery.  Make sure that you call for this appointment within a day or two after you arrive home to insure a convenient appointment time. °10. OTHER INSTRUCTIONS: __________________________________________________________________________________________________________________________ __________________________________________________________________________________________________________________________ °WHEN TO CALL YOUR DOCTOR: °1. Fever over 101.0 °2. Inability to urinate °3. Continued bleeding from incision. °4. Increased pain, redness, or drainage from the incision. °5. Increasing abdominal pain ° °The clinic staff is available to answer your questions during regular business hours.  Please don’t hesitate to call and ask to speak to one of the nurses for clinical concerns.  If you have a medical emergency, go to the nearest emergency room or call 911.  A surgeon from [facility] is always on call at the hospital. °[address], [address], [place], [place]  [address] ? [address] [address], [place], [place]   [address] °[phone] ? [phone] ? FAX [phone] °Web site:   [email or web address] °

## 2015-03-29 NOTE — Progress Notes (Signed)
Patient ID: Ashlee Acosta, female   DOB: January 29, 1996, 20 y.o.   MRN: [id number] 1 Day Post-Op  Subjective: Pt has a bad HA.  Also having a lot of pain and can't sleep due to pain.  Ate some pudding last night with no issues.  No nausea this morning  Objective: Vital signs in last 24 hours: Temp:  [97.6 F (36.4 C)-98.9 F (37.2 C)] 98.9 F (37.2 C) (01/03 0512) Pulse Rate:  [69-108] 100 (01/03 0512) Resp:  [14-19] 18 (01/03 0512) BP: (95-120)/(58-87) 107/73 mmHg (01/03 0512) SpO2:  [98 %-100 %] 99 % (01/03 0512) Weight:  [63.504 kg (140 lb)] 63.504 kg (140 lb) (01/02 1006) Last BM Date: 03/27/15  Intake/Output from previous day: 01/02 0701 - 01/03 0700 In: 650 [I.V.:650] Out: 955 [Urine:950; Blood:5] Intake/Output this shift:    PE: Abd: soft, quite tender, but appropriately so, few BS, ND, incisions c/d/i Heart: regular LungS: CTAB  Lab Results:   Recent Labs  03/28/15 1015  WBC 11.2*  HGB 13.6  HCT 39.1  PLT 241   BMET  Recent Labs  03/28/15 1015  NA 141  K 4.0  CL 109  CO2 24  GLUCOSE 102*  BUN 10  CREATININE 0.74  CALCIUM 9.3   PT/INR No results for input(s): LABPROT, INR in the last 72 hours. CMP     Component Value Date/Time   NA 141 03/28/2015 1015   K 4.0 03/28/2015 1015   CL 109 03/28/2015 1015   CO2 24 03/28/2015 1015   GLUCOSE 102* 03/28/2015 1015   BUN 10 03/28/2015 1015   CREATININE 0.74 03/28/2015 1015   CALCIUM 9.3 03/28/2015 1015   PROT 5.9* 03/28/2015 1015   ALBUMIN 3.6 03/28/2015 1015   AST 16 03/28/2015 1015   ALT 10* 03/28/2015 1015   ALKPHOS 48 03/28/2015 1015   BILITOT 0.8 03/28/2015 1015   GFRNONAA >60 03/28/2015 1015   GFRAA >60 03/28/2015 1015   Lipase     Component Value Date/Time   LIPASE 27 03/28/2015 1015       Studies/Results: Ct Abdomen Pelvis W Contrast  03/28/2015  CLINICAL DATA:  Right lower quadrant pain radiating to the right flank today. EXAM: CT ABDOMEN AND PELVIS WITH CONTRAST TECHNIQUE:  Multidetector CT imaging of the abdomen and pelvis was performed using the standard protocol following bolus administration of intravenous contrast. CONTRAST:  OMNIPAQUE IOHEXOL 300 MG/ML  SOLN COMPARISON:  None. FINDINGS: Lower chest:  Unremarkable. Hepatobiliary: 6 mm hypodensity right liver is too small to characterize. Liver otherwise normal in appearance. There is no evidence for gallstones, gallbladder wall thickening, or pericholecystic fluid. No intrahepatic or extrahepatic biliary dilation. Pancreas: No focal mass lesion. No dilatation of the main duct. No intraparenchymal cyst. No peripancreatic edema. Spleen: No splenomegaly. No focal mass lesion. Adrenals/Urinary Tract: No adrenal nodule or mass. Kidneys are normal in appearance bilaterally. No evidence for hydroureter. The urinary bladder appears normal for the degree of distention. Stomach/Bowel: Stomach is nondistended. No gastric wall thickening. No evidence of outlet obstruction. Duodenum is normally positioned as is the ligament of Treitz. No small bowel wall thickening. No small bowel dilatation. The terminal ileum is normal. Tip of appendix dilated up to 11-12 mm diameter with wall thickening and periappendiceal edema/inflammation. No evidence for extraluminal gas or abscess. No gross colonic mass. No colonic wall thickening. No substantial diverticular change. Vascular/Lymphatic: No abdominal aortic aneurysm. No abdominal atherosclerotic calcification. There is no gastrohepatic or hepatoduodenal ligament lymphadenopathy. No intraperitoneal or retroperitoneal  lymphadenopy. Small lymph nodes identified in the ileocolic ligament. No pelvic sidewall lymphadenopathy. Reproductive: Uterus is unremarkable.  There is no adnexal mass. Other: No substantial intraperitoneal free fluid. Musculoskeletal: Bone windows reveal no worrisome lytic or sclerotic osseous lesions. IMPRESSION: Dilated, inflamed appendix with periappendiceal edema/ inflammation.  CT imaging features consistent with acute appendicitis. I personally discussed these findings by telephone with Dr. Donnald GarrePfeiffer at 1230 hours on 03/27/2014. Electronically Signed   By: Kennith CenterEric  Mansell M.D.   On: 03/28/2015 12:29    Anti-infectives: Anti-infectives    Start     Dose/Rate Route Frequency Ordered Stop   03/28/15 1545  cefTRIAXone (ROCEPHIN) 2 g in dextrose 5 % 50 mL IVPB     2 g 100 mL/hr over 30 Minutes Intravenous Every 24 hours 03/28/15 1535         Assessment/Plan  POD 1, s/p lap appy -patient having issues with pain control.  Suspect IV pain meds are giving her a HA.  Will stop these.  Start Oxy IR, schedule toradol 30mg , and robaxin prn.  Hopefully this will help her HA, but also control her abdominal pain -regular diet -mobilize and pulm toilet -will recheck later today and see if she can go home, but if she continues to have issues with pain control, then she may need to stay until tomorrow. DVT proph: Lovenox/SCDs     Aceton Kinnear E 03/29/2015, 7:56 AM Pager: [phone]

## 2015-11-23 ENCOUNTER — Other Ambulatory Visit: Payer: Self-pay | Admitting: Certified Nurse Midwife

## 2015-11-23 ENCOUNTER — Ambulatory Visit
Admission: RE | Admit: 2015-11-23 | Discharge: 2015-11-23 | Disposition: A | Discharge status: Home / Self Care | Payer: BLUE CROSS/BLUE SHIELD | Source: Ambulatory Visit | Attending: Certified Nurse Midwife | Admitting: Certified Nurse Midwife

## 2015-11-23 DIAGNOSIS — N644 Mastodynia: Secondary | ICD-10-CM

## 2015-11-23 DIAGNOSIS — N6310 Unspecified lump in the right breast, unspecified quadrant: Secondary | ICD-10-CM

## 2016-09-28 IMAGING — CT CT ABD-PELV W/ CM
2 of 4 series · 15 of 46 positions shown, 17 images · IV contrast (Omni 300)
Comparison: None.

CLINICAL DATA: Right lower quadrant pain radiating to the right
flank today.

EXAM:
CT ABDOMEN AND PELVIS WITH CONTRAST
TECHNIQUE: Multidetector CT imaging of the abdomen and pelvis was performed
using the standard protocol following bolus administration of
intravenous contrast.
CONTRAST:  100mL OMNIPAQUE IOHEXOL 300 MG/ML  SOLN

[Series 2: a/p w/ 5mm · axial · 0.59mm/px · z∈[+694,+1114]mm · 12 of 96 slices shown, 14 images]
[im 8/96  soft-tissue]
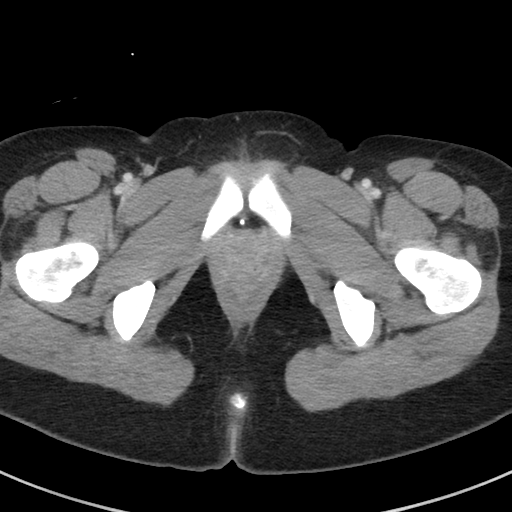
[im 8/96  bone]
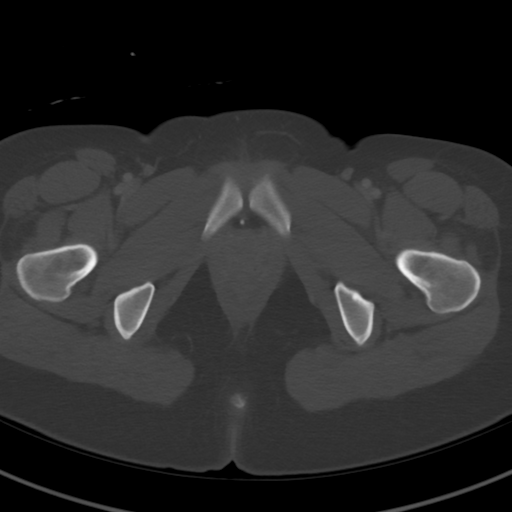
[im 16/96  soft-tissue]
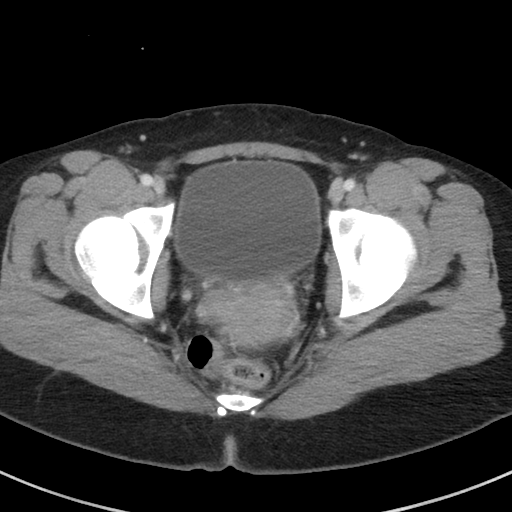
[im 23/96  soft-tissue]
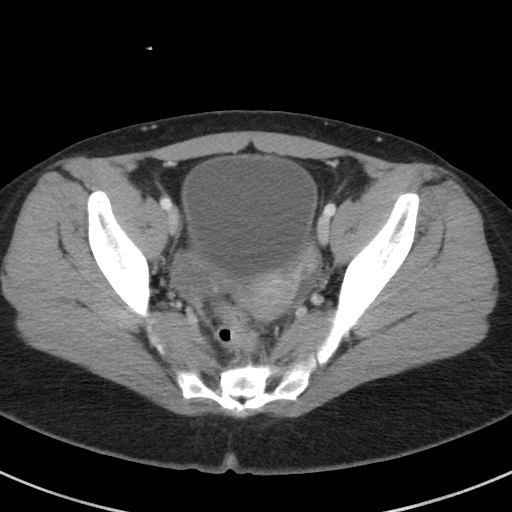
[im 31/96  soft-tissue]
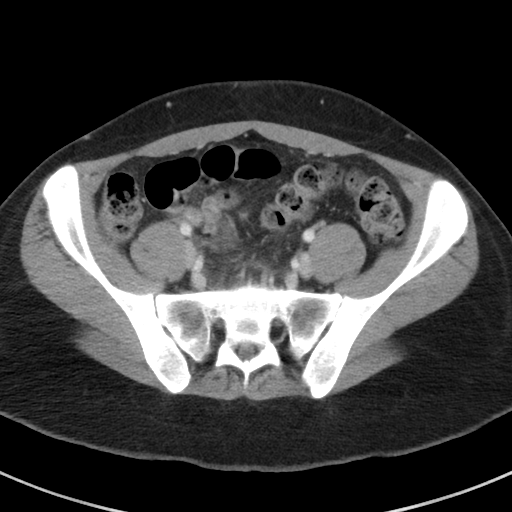
[im 39/96  soft-tissue]
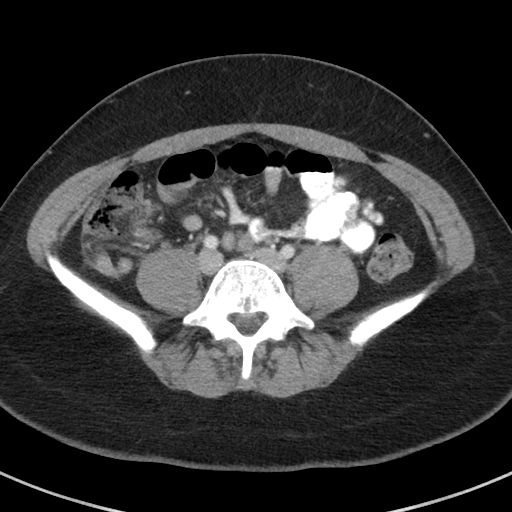
[im 46/96  soft-tissue]
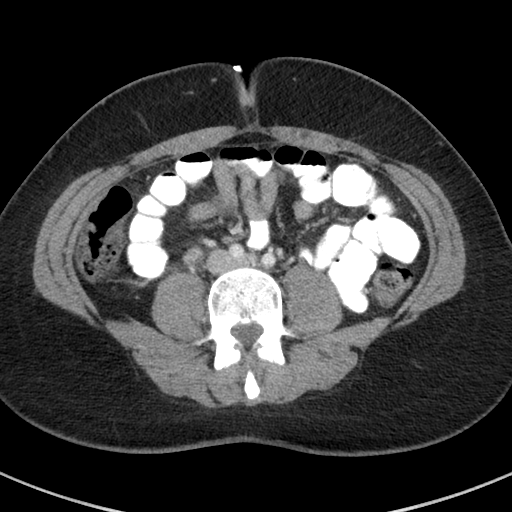
[im 54/96  soft-tissue]
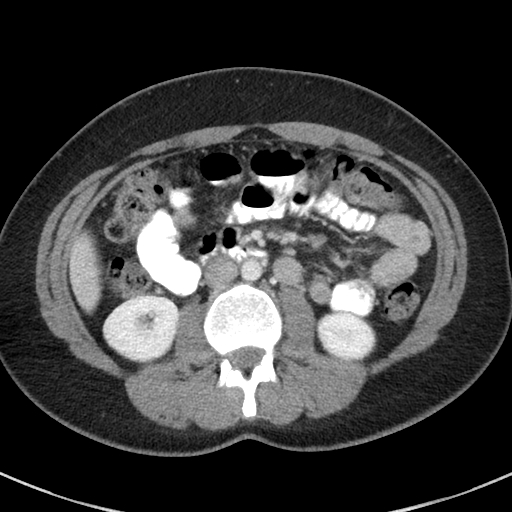
[im 61/96  soft-tissue]
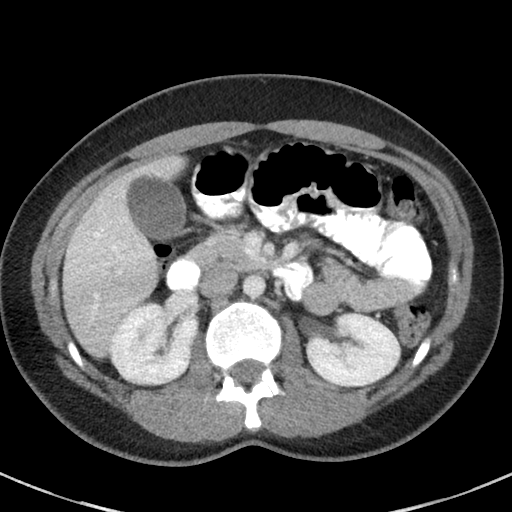
[im 69/96  soft-tissue]
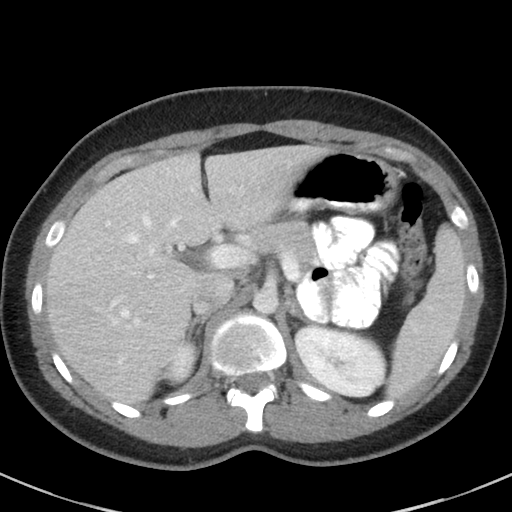
[im 69/96  bone]
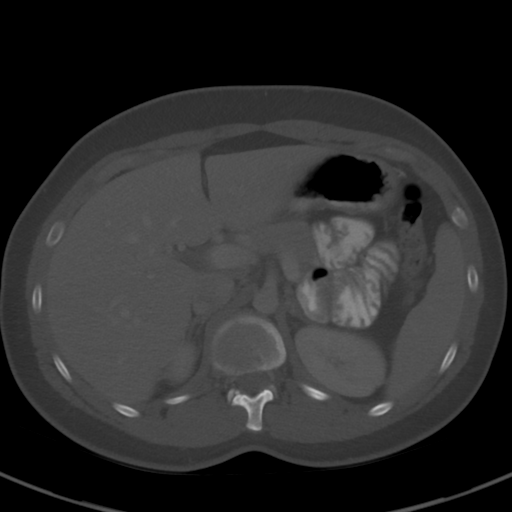
[im 77/96  soft-tissue]
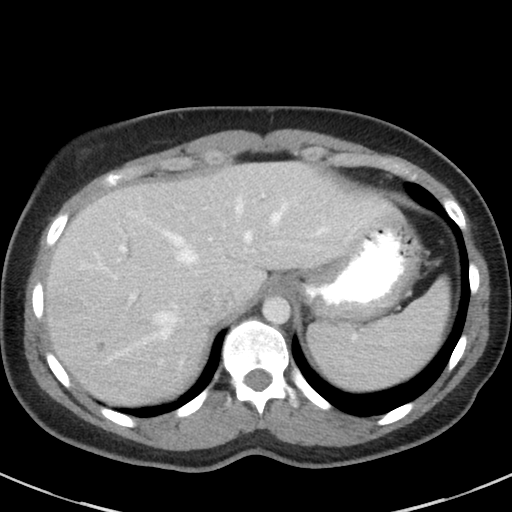
[im 84/96  soft-tissue]
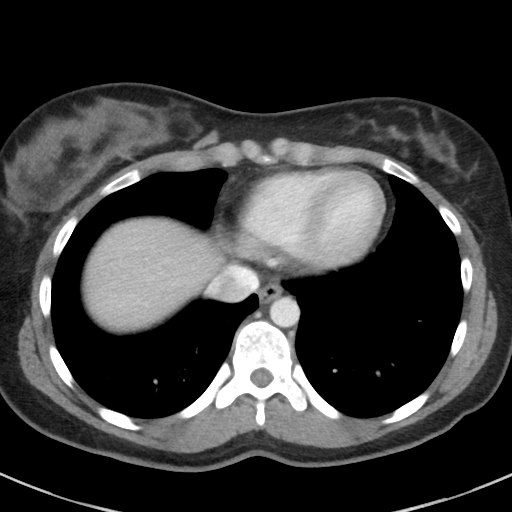
[im 92/96  soft-tissue]
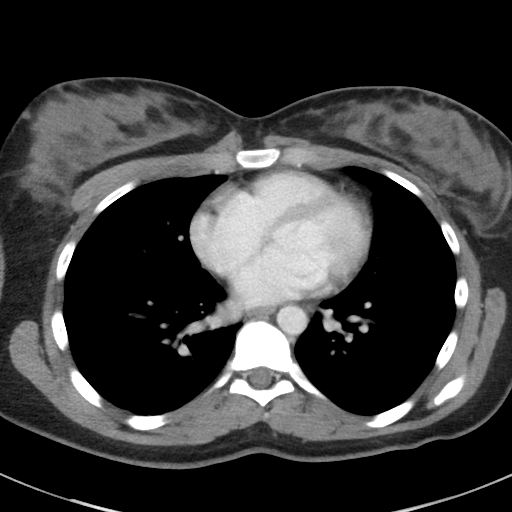

[Series 5: a/p w/ cor · coronal · 0.72mm/px · 3 of 111 slices shown]
[im 37/111  soft-tissue]
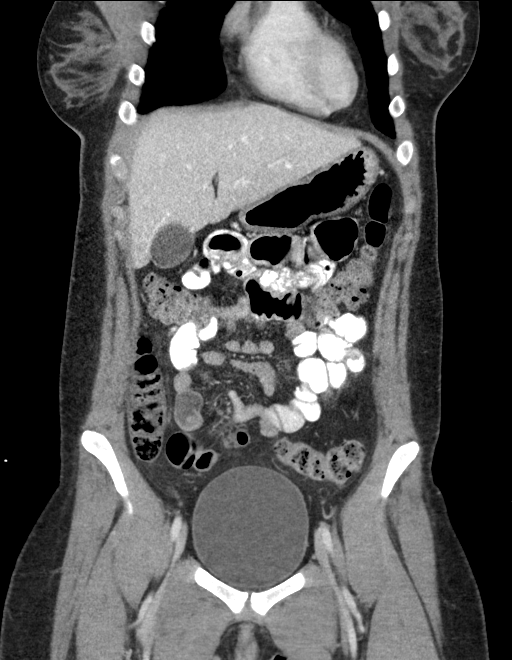
[im 49/111  soft-tissue]
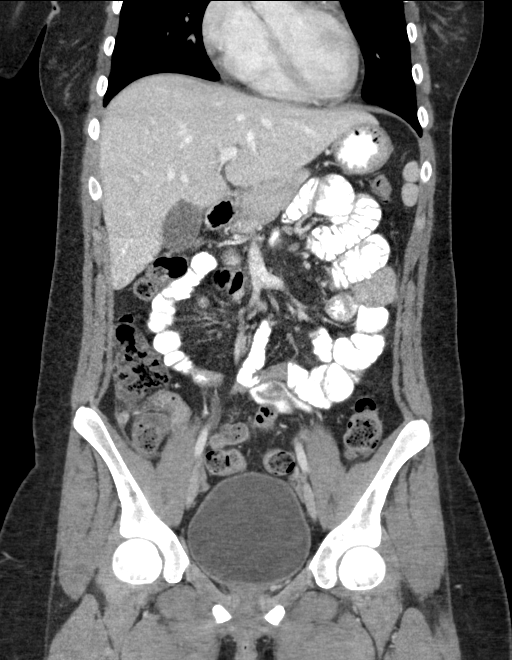
[im 62/111  soft-tissue]
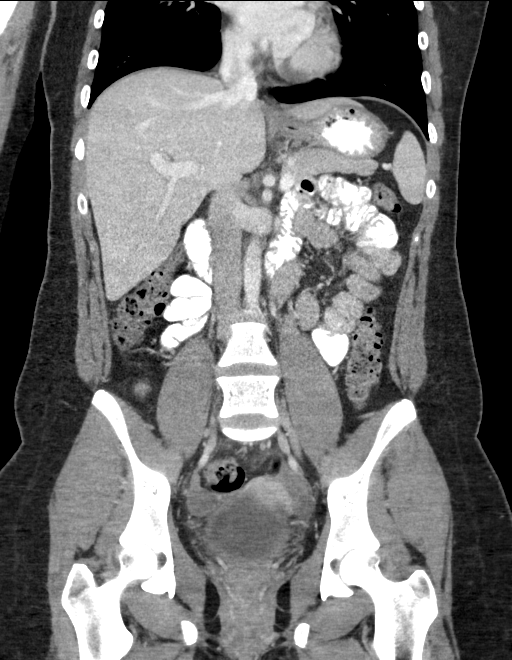

[15 of 46 positions shown; findings below may reference images not displayed]

FINDINGS: Lower chest:  Unremarkable.

Hepatobiliary: 6 mm hypodensity right liver is too small to
characterize. Liver otherwise normal in appearance. There is no
evidence for gallstones, gallbladder wall thickening, or
pericholecystic fluid. No intrahepatic or extrahepatic biliary
dilation.

Pancreas: No focal mass lesion. No dilatation of the main duct. No
intraparenchymal cyst. No peripancreatic edema.

Spleen: No splenomegaly. No focal mass lesion.

Adrenals/Urinary Tract: No adrenal nodule or mass. Kidneys are
normal in appearance bilaterally. No evidence for hydroureter. The
urinary bladder appears normal for the degree of distention.

Stomach/Bowel: Stomach is nondistended. No gastric wall thickening.
No evidence of outlet obstruction. Duodenum is normally positioned
as is the ligament of Treitz. No small bowel wall thickening. No
small bowel dilatation. The terminal ileum is normal. Tip of
appendix dilated up to 11-12 mm diameter with wall thickening and
periappendiceal edema/inflammation. No evidence for extraluminal gas
or abscess. No gross colonic mass. No colonic wall thickening. No
substantial diverticular change.

Vascular/Lymphatic: No abdominal aortic aneurysm. No abdominal
atherosclerotic calcification. There is no gastrohepatic or
hepatoduodenal ligament lymphadenopathy. No intraperitoneal or
retroperitoneal lymphadenopy. Small lymph nodes identified in the
ileocolic ligament. No pelvic sidewall lymphadenopathy.

Reproductive: Uterus is unremarkable.  There is no adnexal mass.

Other: No substantial intraperitoneal free fluid.

Musculoskeletal: Bone windows reveal no worrisome lytic or sclerotic
osseous lesions.
IMPRESSION: Dilated, inflamed appendix with periappendiceal edema/ inflammation.
CT imaging features consistent with acute appendicitis.

I personally discussed these findings by telephone with Dr. Sang

## 2019-06-22 ENCOUNTER — Other Ambulatory Visit: Payer: Self-pay

## 2019-06-22 ENCOUNTER — Ambulatory Visit (INDEPENDENT_AMBULATORY_CARE_PROVIDER_SITE_OTHER): Payer: BC Managed Care – PPO | Admitting: Dermatology

## 2019-06-22 VITALS — BP 110/74 | HR 71

## 2019-06-22 DIAGNOSIS — L700 Acne vulgaris: Secondary | ICD-10-CM

## 2019-06-22 MED ORDER — ADAPALENE 0.3 % EX GEL: 1.0000 "application " | Freq: Every day | CUTANEOUS | 3 refills | Status: AC

## 2019-06-22 MED ORDER — SPIRONOLACTONE 100 MG PO TABS: 100.0000 mg | ORAL_TABLET | Freq: Every day | ORAL | 3 refills | Status: AC

## 2019-06-22 NOTE — Progress Notes (Signed)
   Follow-Up Visit   Subjective  Ashlee Acosta is a 24 y.o. female who presents for the following: Acne (Improving. Epiduo Forte qohs. Gets irritated and red it uses daily. Washes with Cetaphil Foaming Wash.).  Tends to break out with period, but overall breakouts are less frequent.  Has had one chemical peel with Boneta Lucks to work on superficial acne scarring.    The following portions of the chart were reviewed this encounter and updated as appropriate:     Review of Systems: No other skin or systemic complaints.  Objective  Well appearing patient in no apparent distress; mood and affect are within normal limits.  A focused examination was performed including face. Relevant physical exam findings are noted in the Assessment and Plan.  Objective  Face: Inflammatory papules on L chin and L temple. Inflamed comedones on R jaw. Violaceous macules and superficial scarring on cheeks. BP 110/74  Assessment & Plan  Acne vulgaris Face  Improving.   D/C Epiduo Forte due to skin irritation.  Start Adapalene 0.3% gel qhs Start Spironolactone 100 mg PO qd Spironolactone can cause increased urination and cause blood pressure to decrease. Please watch for signs of lightheadedness and be cautious when changing position. It can sometimes cause breast tenderness or an irregular period in premenopausal women. It can also increase potassium. The increase in potassium usually is not a concern unless you are taking other medicines that also increase potassium, so please be sure your doctor knows all of the other medications you are taking. This medication should not be taken  by pregnant women.   Patient has appointment with Boneta Lucks next week for second chemical peel.  Recommend daily broad spectrum sunscreen SPF 30+ to help with discoloration of superficial scarring. Sample of CeraVe AM.  spironolactone (ALDACTONE) 100 MG tablet - Face  Adapalene (DIFFERIN) 0.3 % gel - Face  Return in about 10 weeks  (around 08/31/2019) for acne.   ICherlyn Labella, CMA, am acting as scribe for Willeen Niece, MD .

## 2019-06-29 ENCOUNTER — Ambulatory Visit (INDEPENDENT_AMBULATORY_CARE_PROVIDER_SITE_OTHER): Payer: Self-pay

## 2019-06-29 ENCOUNTER — Other Ambulatory Visit: Payer: Self-pay

## 2019-06-29 DIAGNOSIS — L810 Postinflammatory hyperpigmentation: Secondary | ICD-10-CM

## 2019-06-29 DIAGNOSIS — L708 Other acne: Secondary | ICD-10-CM

## 2019-08-03 ENCOUNTER — Ambulatory Visit (INDEPENDENT_AMBULATORY_CARE_PROVIDER_SITE_OTHER): Payer: Self-pay

## 2019-08-03 ENCOUNTER — Other Ambulatory Visit: Payer: Self-pay

## 2019-08-03 DIAGNOSIS — L810 Postinflammatory hyperpigmentation: Secondary | ICD-10-CM

## 2019-08-03 DIAGNOSIS — L730 Acne keloid: Secondary | ICD-10-CM

## 2019-08-03 DIAGNOSIS — L708 Other acne: Secondary | ICD-10-CM

## 2019-08-31 ENCOUNTER — Ambulatory Visit: Payer: BC Managed Care – PPO | Admitting: Dermatology

## 2019-11-18 ENCOUNTER — Other Ambulatory Visit: Payer: Self-pay | Admitting: Dermatology

## 2019-11-18 DIAGNOSIS — L700 Acne vulgaris: Secondary | ICD-10-CM

## 2020-06-18 ENCOUNTER — Other Ambulatory Visit: Payer: Self-pay | Admitting: Dermatology

## 2020-06-18 DIAGNOSIS — L700 Acne vulgaris: Secondary | ICD-10-CM
